# Patient Record
Sex: Male | Born: 2005
Health system: Southern US, Community
[De-identification: ages and names within clinical notes are randomized; demographics above are authoritative.]

---
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
## 2023-02-25 ENCOUNTER — Ambulatory Visit: Payer: Managed Care, Other (non HMO) | Admitting: Family Medicine

## 2023-02-25 ENCOUNTER — Encounter: Payer: Self-pay | Admitting: Family Medicine

## 2023-02-25 VITALS — BP 134/82 | HR 60 | Temp 97.9°F | Ht 66.0 in | Wt 155.2 lb

## 2023-02-25 DIAGNOSIS — Z0000 Encounter for general adult medical examination without abnormal findings: Secondary | ICD-10-CM

## 2023-02-25 DIAGNOSIS — F4320 Adjustment disorder, unspecified: Secondary | ICD-10-CM

## 2023-02-25 DIAGNOSIS — Z113 Encounter for screening for infections with a predominantly sexual mode of transmission: Secondary | ICD-10-CM

## 2023-02-25 DIAGNOSIS — Z1159 Encounter for screening for other viral diseases: Secondary | ICD-10-CM | POA: Diagnosis not present

## 2023-02-25 LAB — CBC WITH DIFFERENTIAL/PLATELET
Basophils Absolute: 0 10*3/uL (ref 0.0–0.1)
Basophils Relative: 0.2 % (ref 0.0–3.0)
Eosinophils Absolute: 0.1 10*3/uL (ref 0.0–0.7)
Eosinophils Relative: 1.1 % (ref 0.0–5.0)
HCT: 41.3 % (ref 36.0–49.0)
Hemoglobin: 14 g/dL (ref 12.0–16.0)
Lymphocytes Relative: 36.7 % (ref 24.0–48.0)
Lymphs Abs: 2 10*3/uL (ref 0.7–4.0)
MCHC: 34 g/dL (ref 31.0–37.0)
MCV: 92.7 fl (ref 78.0–98.0)
Monocytes Absolute: 0.4 10*3/uL (ref 0.1–1.0)
Monocytes Relative: 7 % (ref 3.0–12.0)
Neutro Abs: 3.1 10*3/uL (ref 1.4–7.7)
Neutrophils Relative %: 55 % (ref 43.0–71.0)
Platelets: 251 10*3/uL (ref 150.0–575.0)
RBC: 4.45 Mil/uL (ref 3.80–5.70)
RDW: 12.7 % (ref 11.4–15.5)
WBC: 5.5 10*3/uL (ref 4.5–13.5)

## 2023-02-25 LAB — COMPREHENSIVE METABOLIC PANEL
ALT: 19 U/L (ref 0–53)
AST: 28 U/L (ref 0–37)
Albumin: 4.6 g/dL (ref 3.5–5.2)
Alkaline Phosphatase: 77 U/L (ref 52–171)
BUN: 11 mg/dL (ref 6–23)
CO2: 31 mEq/L (ref 19–32)
Calcium: 9.4 mg/dL (ref 8.4–10.5)
Chloride: 101 mEq/L (ref 96–112)
Creatinine, Ser: 1.01 mg/dL (ref 0.40–1.50)
GFR: 108.43 mL/min (ref >60.00)
Glucose, Bld: 102 mg/dL — ABNORMAL HIGH (ref 70–99)
Potassium: 4.2 mEq/L (ref 3.5–5.1)
Sodium: 138 mEq/L (ref 135–145)
Total Bilirubin: 0.3 mg/dL (ref 0.3–1.2)
Total Protein: 7.3 g/dL (ref 6.0–8.3)

## 2023-02-25 LAB — LIPID PANEL
Cholesterol: 184 mg/dL (ref 0–200)
HDL: 78.6 mg/dL (ref >39.00)
LDL Cholesterol: 93 mg/dL (ref 0–99)
NonHDL: 105.81
Total CHOL/HDL Ratio: 2
Triglycerides: 63 mg/dL (ref 0.0–149.0)
VLDL: 12.6 mg/dL (ref 0.0–40.0)

## 2023-02-25 LAB — HEMOGLOBIN A1C: Hgb A1c MFr Bld: 5.7 % (ref 4.6–6.5)

## 2023-02-25 LAB — T4, FREE: Free T4: 0.8 ng/dL (ref 0.60–1.60)

## 2023-02-25 LAB — TSH: TSH: 1.83 u[IU]/mL (ref 0.40–5.00)

## 2023-02-25 LAB — VITAMIN B12: Vitamin B-12: 427 pg/mL (ref 211–911)

## 2023-02-25 NOTE — Progress Notes (Signed)
Established Patient Office Visit   Subjective  Patient ID: Luis Ramos, male    DOB: [birth year]  Age: 17 y.o. MRN: [id number]  Chief Complaint  Patient presents with   Establish Care    Nothing specific to discuss    Patient is an 17 year old male with no significant pmh who presents for establish care and CPE.  Patient states he is well overall.  Not currently taking any medications.  Endorses feeling off at times.  Has noticed increased worrying about things but is not that serious.  Patient is a Printmaker at [employer]. Also notes not as confident in himself when playing football.  Patient notes sleep, mood, energy, appetite are good.  Patient thinks symptoms may be related to starting college.  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  Allergies: NKDA  Surgeries: tonsil and adenoidectomy 2-3 years ago.       ROS Negative unless stated above    Objective:     BP 134/82 (BP Location: Right Arm, Patient Position: Sitting, Cuff Size: Normal)   Pulse 60   Temp 97.9 F (36.6 C) (Oral)   Ht  (1.676 m)   Wt 155 lb 3.2 oz (70.4 kg)   SpO2 99%   BMI 25.05 kg/m     Physical Exam Constitutional:      Appearance: Normal appearance.  HENT:     Head: Normocephalic and atraumatic.     Right Ear: Tympanic membrane, ear canal and external ear normal.     Left Ear: Tympanic membrane, ear canal and external ear normal.     Nose: Nose normal.     Mouth/Throat:     Mouth: Mucous membranes are moist.     Pharynx: No oropharyngeal exudate or posterior oropharyngeal erythema.  Eyes:     General: No scleral icterus.    Extraocular Movements: Extraocular movements intact.     Conjunctiva/sclera: Conjunctivae normal.     Pupils: Pupils are equal, round, and reactive to light.  Neck:     Thyroid: No thyromegaly.  Cardiovascular:     Rate and Rhythm: Normal rate and regular rhythm.     Pulses: Normal pulses.     Heart sounds: Normal heart sounds. No murmur heard.     No friction rub.  Pulmonary:     Effort: Pulmonary effort is normal.     Breath sounds: Normal breath sounds. No wheezing, rhonchi or rales.  Abdominal:     General: Bowel sounds are normal.     Palpations: Abdomen is soft.     Tenderness: There is no abdominal tenderness.  Musculoskeletal:        General: No deformity. Normal range of motion.  Lymphadenopathy:     Cervical: No cervical adenopathy.  Skin:    General: Skin is warm and dry.     Findings: No lesion.  Neurological:     General: No focal deficit present.     Mental Status: He is alert and oriented to person, place, and time.  Psychiatric:        Mood and Affect: Mood normal.        Thought Content: Thought content normal.        02/25/2023    8:15 AM  Depression screen PHQ 2/9  Decreased Interest 0  Down, Depressed, Hopeless 0  PHQ - 2 Score 0      02/25/2023    8:15 AM  GAD 7 : Generalized Anxiety Score  Nervous, Anxious, on Edge  0  Control/stop worrying 0  Worry too much - different things 1  Trouble relaxing 0  Restless 0  Easily annoyed or irritable 0  Afraid - awful might happen 0  Total GAD 7 Score 1     No results found for any visits on 02/25/23.    Assessment & Plan:  Well adult exam -Anticipatory guidance given including wearing seatbelts, smoke detectors in the home, increasing physical activity, increasing p.o. intake of water and vegetables. -labs -Age-appropriate health screenings -Immunizations reviewed -Next CPE in 1 year -     CBC with Differential/Platelet -     TSH -     T4, free -     Hemoglobin A1c -     Lipid panel -     Comprehensive metabolic panel  Routine screening for STI (sexually transmitted infection) -     RPR -     HIV Antibody (routine testing w rflx) -     C. trachomatis/N. gonorrhoeae RNA  Encounter for hepatitis C screening test for low risk patient -     Hepatitis C antibody  Adjustment disorder, unspecified type -PHQ-9 score 0 -GAD-7 score  1 -Discussed importance of self-care -Given information on counseling.  Consider medication options for continued or worsening symptoms. -Follow-up with 6 to 8 weeks if needed for continued symptoms. -     TSH -     T4, free -     Vitamin B12   Return in about 6 weeks (around 04/08/2023), or if symptoms worsen or fail to improve.   Deeann Saint, MD

## 2023-02-25 NOTE — Patient Instructions (Signed)
Behavioral Health Services: -to make an appointment contact the office/provider you are interested in seeing.  No referral is needed.  The below is not an all inclusive list, but will help you get started.  [email or web address] -counseling located off of [address]  [email or web address] -website helps you find providers in your area  [facility] -Located off of [address] across from [employer]  Dr. Akintayo is a Psychiatrist with [place]. [phone]  [facility]  [facility]  -[address]  [phone] -a place in town that has counseling and Psychiatry services.    

## 2023-02-26 LAB — HIV ANTIBODY (ROUTINE TESTING W REFLEX): HIV 1&2 Ab, 4th Generation: NONREACTIVE

## 2023-02-26 LAB — C. TRACHOMATIS/N. GONORRHOEAE RNA
C. trachomatis RNA, TMA: DETECTED — AB
N. gonorrhoeae RNA, TMA: NOT DETECTED

## 2023-02-26 LAB — RPR: RPR Ser Ql: NONREACTIVE

## 2023-02-26 LAB — HEPATITIS C ANTIBODY: Hepatitis C Ab: NONREACTIVE

## 2023-02-26 NOTE — Telephone Encounter (Signed)
Forms placed in scan bin

## 2023-02-27 ENCOUNTER — Other Ambulatory Visit: Payer: Self-pay | Admitting: Family Medicine

## 2023-02-27 DIAGNOSIS — A749 Chlamydial infection, unspecified: Secondary | ICD-10-CM

## 2023-02-27 MED ORDER — DOXYCYCLINE HYCLATE 100 MG PO TABS
100.0000 mg | ORAL_TABLET | Freq: Two times a day (BID) | ORAL | 0 refills | Status: AC
Start: 2023-02-27 — End: 2023-03-06

## 2023-09-21 ENCOUNTER — Other Ambulatory Visit: Payer: Self-pay | Admitting: Family Medicine

## 2023-09-21 DIAGNOSIS — A749 Chlamydial infection, unspecified: Secondary | ICD-10-CM

## 2023-09-23 ENCOUNTER — Encounter: Payer: Self-pay | Admitting: Family Medicine

## 2023-09-23 ENCOUNTER — Ambulatory Visit: Payer: Managed Care, Other (non HMO) | Admitting: Family Medicine

## 2023-09-23 VITALS — BP 98/68 | HR 82 | Temp 98.4°F | Ht 66.0 in | Wt 151.0 lb

## 2023-09-23 DIAGNOSIS — Z202 Contact with and (suspected) exposure to infections with a predominantly sexual mode of transmission: Secondary | ICD-10-CM | POA: Diagnosis not present

## 2023-09-23 MED ORDER — CEFTRIAXONE SODIUM 500 MG IJ SOLR
500.0000 mg | Freq: Once | INTRAMUSCULAR | Status: AC
  Administered 2023-09-23: 500 mg via INTRAMUSCULAR

## 2023-09-23 MED ORDER — DOXYCYCLINE HYCLATE 100 MG PO TABS: 100.0000 mg | ORAL_TABLET | Freq: Two times a day (BID) | ORAL | 0 refills | Status: AC

## 2023-09-23 NOTE — Addendum Note (Signed)
Addended by: Abbe Amsterdam R on: 09/23/2023 02:35 PM   Modules accepted: Level of Service

## 2023-09-23 NOTE — Progress Notes (Signed)
Established Patient Office Visit   Subjective  Patient ID: Luis Ramos, male    DOB: 2006-09-11  Age: 17 y.o. MRN: [id number]  Chief Complaint  Patient presents with   Exposure to STD    Started a week ago     Patient is a 17 year old male seen for acute concern.  Patient endorses being advised that he had chlamydia by one of his partners.  Endorses unprotected intercourse last Thursday with partner.  Pt states he feels like he did once before when he tested positive.  States at times has a bump in genital area that is non pruritic, does not burn, and is not painful.  Pt denies penile d/c, dysuria, frequency.  Patient mentions mood has improved since last visit.  Football is going well.  Exposure to STD    There are no problems to display for this patient.  History reviewed. No pertinent past medical history. Past Surgical History:  Procedure Laterality Date   TONSILLECTOMY AND ADENOIDECTOMY  2022   Social History   Tobacco Use   Smoking status: Never   Smokeless tobacco: Never  Substance Use Topics   Alcohol use: Never   Drug use: Never   History reviewed. No pertinent family history. No Known Allergies    ROS Negative unless stated above    Objective:     BP 98/68 (BP Location: Right Arm, Patient Position: Sitting, Cuff Size: Normal)   Pulse 82   Temp 98.4 F (36.9 C) (Oral)   Ht 5\' 6"  (1.676 m)   Wt 151 lb (68.5 kg)   SpO2 97%   BMI 24.37 kg/m  BP Readings from Last 3 Encounters:  09/23/23 98/68  02/25/23 134/82  07/01/22 125/76   Wt Readings from Last 3 Encounters:  09/23/23 151 lb (68.5 kg) (46%, Z= -0.10)*  02/25/23 155 lb 3.2 oz (70.4 kg) (56%, Z= 0.16)*  07/01/22 155 lb (70.3 kg) (60%, Z= 0.26)*   * Growth percentiles are based on CDC (Boys, 2-20 Years) data.      Physical Exam Constitutional:      General: He is not in acute distress.    Appearance: Normal appearance.  HENT:     Head: Normocephalic and atraumatic.     Nose:  Nose normal.     Mouth/Throat:     Mouth: Mucous membranes are moist.  Eyes:     Extraocular Movements: Extraocular movements intact.     Conjunctiva/sclera: Conjunctivae normal.  Cardiovascular:     Rate and Rhythm: Normal rate and regular rhythm.     Heart sounds: Normal heart sounds. No murmur heard.    No gallop.  Pulmonary:     Effort: Pulmonary effort is normal. No respiratory distress.     Breath sounds: Normal breath sounds. No wheezing, rhonchi or rales.  Skin:    General: Skin is warm and dry.  Neurological:     Mental Status: He is alert and oriented to person, place, and time.     No results found for any visits on 09/23/23.    Assessment & Plan:  Possible exposure to STI -     C. trachomatis/N. gonorrhoeae RNA -     RPR -     HIV Antibody (routine testing w rflx) -     cefTRIAXone Sodium -     Doxycycline Hyclate; Take 1 tablet (100 mg total) by mouth 2 (two) times daily for 7 days.  Dispense: 14 tablet; Refill: 0  Exposure to chlamydia.  Obtain  STI testing.  Will treat for chlamydia and gonorrhea while waiting on results.  Given dose of ceftriaxone 500 mg IM in clinic.  Rx for doxycycline sent to pharmacy for chlamydia.  Patient advised to complete antibiotic course.  Counseling and condoms provided.  Return if symptoms worsen or fail to improve.   Deeann Saint, MD

## 2023-09-23 NOTE — Patient Instructions (Signed)
Complete full course of doxycycline.

## 2023-09-24 LAB — C. TRACHOMATIS/N. GONORRHOEAE RNA
C. trachomatis RNA, TMA: DETECTED — AB
N. gonorrhoeae RNA, TMA: NOT DETECTED

## 2023-09-24 LAB — HIV ANTIBODY (ROUTINE TESTING W REFLEX): HIV 1&2 Ab, 4th Generation: NONREACTIVE

## 2023-09-24 LAB — RPR: RPR Ser Ql: NONREACTIVE

## 2024-01-06 ENCOUNTER — Ambulatory Visit (INDEPENDENT_AMBULATORY_CARE_PROVIDER_SITE_OTHER): Payer: Managed Care, Other (non HMO) | Admitting: Family Medicine

## 2024-01-06 DIAGNOSIS — Z91199 Patient's noncompliance with other medical treatment and regimen due to unspecified reason: Secondary | ICD-10-CM

## 2024-01-06 NOTE — Progress Notes (Signed)
    Canceled ~15 minutes prior to appt

## 2024-03-25 ENCOUNTER — Encounter: Payer: Self-pay | Admitting: Family Medicine

## 2024-03-25 ENCOUNTER — Ambulatory Visit (INDEPENDENT_AMBULATORY_CARE_PROVIDER_SITE_OTHER): Admitting: Family Medicine

## 2024-03-25 VITALS — BP 120/82 | HR 88 | Temp 98.0°F | Ht 66.14 in | Wt 159.0 lb

## 2024-03-25 DIAGNOSIS — Z0000 Encounter for general adult medical examination without abnormal findings: Secondary | ICD-10-CM | POA: Diagnosis not present

## 2024-03-25 DIAGNOSIS — Z113 Encounter for screening for infections with a predominantly sexual mode of transmission: Secondary | ICD-10-CM

## 2024-03-25 DIAGNOSIS — Z889 Allergy status to unspecified drugs, medicaments and biological substances status: Secondary | ICD-10-CM

## 2024-03-25 LAB — COMPREHENSIVE METABOLIC PANEL WITH GFR
ALT: 17 U/L (ref 0–53)
AST: 25 U/L (ref 0–37)
Albumin: 4.5 g/dL (ref 3.5–5.2)
Alkaline Phosphatase: 71 U/L (ref 52–171)
BUN: 14 mg/dL (ref 6–23)
CO2: 32 meq/L (ref 19–32)
Calcium: 9.5 mg/dL (ref 8.4–10.5)
Chloride: 102 meq/L (ref 96–112)
Creatinine, Ser: 1.04 mg/dL (ref 0.40–1.50)
GFR: 103.89 mL/min (ref >60.00)
Glucose, Bld: 101 mg/dL — ABNORMAL HIGH (ref 70–99)
Potassium: 4.2 meq/L (ref 3.5–5.1)
Sodium: 139 meq/L (ref 135–145)
Total Bilirubin: 0.3 mg/dL (ref 0.2–1.2)
Total Protein: 7 g/dL (ref 6.0–8.3)

## 2024-03-25 LAB — CBC WITH DIFFERENTIAL/PLATELET
Basophils Absolute: 0 10*3/uL (ref 0.0–0.1)
Basophils Relative: 0.4 % (ref 0.0–3.0)
Eosinophils Absolute: 0 10*3/uL (ref 0.0–0.7)
Eosinophils Relative: 0.6 % (ref 0.0–5.0)
HCT: 41.5 % (ref 36.0–49.0)
Hemoglobin: 14.1 g/dL (ref 12.0–16.0)
Lymphocytes Relative: 44.1 % (ref 24.0–48.0)
Lymphs Abs: 1.6 10*3/uL (ref 0.7–4.0)
MCHC: 33.9 g/dL (ref 31.0–37.0)
MCV: 93.6 fl (ref 78.0–98.0)
Monocytes Absolute: 0.4 10*3/uL (ref 0.1–1.0)
Monocytes Relative: 11.3 % (ref 3.0–12.0)
Neutro Abs: 1.6 10*3/uL (ref 1.4–7.7)
Neutrophils Relative %: 43.6 % (ref 43.0–71.0)
Platelets: 243 10*3/uL (ref 150.0–575.0)
RBC: 4.43 Mil/uL (ref 3.80–5.70)
RDW: 12.8 % (ref 11.4–15.5)
WBC: 3.7 10*3/uL — ABNORMAL LOW (ref 4.5–13.5)

## 2024-03-25 LAB — HEMOGLOBIN A1C: Hgb A1c MFr Bld: 5.9 % (ref 4.6–6.5)

## 2024-03-25 MED ORDER — FLUTICASONE PROPIONATE 50 MCG/ACT NA SUSP: 1.0000 | Freq: Every day | NASAL | 5 refills | Status: AC

## 2024-03-25 NOTE — Progress Notes (Signed)
 Established Patient Office Visit   Subjective  Patient ID: Luis Ramos, male    DOB: 27-Jan-2006  Age: 18 y.o. MRN: [id number]  Chief Complaint  Patient presents with   Annual Exam    Patient is a 18 year old male seen for CPE.  Patient states he is doing well overall.  School is going well.  Patient still playing football and [employer].  Has a few weeks off for the summer before football starts.  States mood and energy has been good.  Requesting refill on Flonase.  Patient requesting documentation of visit as physical needed to continue playing sports.    There are no active problems to display for this patient.  History reviewed. No pertinent past medical history. Past Surgical History:  Procedure Laterality Date   TONSILLECTOMY AND ADENOIDECTOMY  2022   Social History   Tobacco Use   Smoking status: Never   Smokeless tobacco: Never  Substance Use Topics   Alcohol use: Never   Drug use: Never   History reviewed. No pertinent family history. No Known Allergies  ROS Negative unless stated above    Objective:      BP 120/82 (BP Location: Left Arm, Patient Position: Sitting, Cuff Size: Normal)   Pulse 88   Temp 98 F (36.7 C) (Oral)   Ht 5' 6.14" (1.68 m)   Wt 159 lb (72.1 kg)   SpO2 98%   BMI 25.55 kg/m  BP Readings from Last 3 Encounters:  03/25/24 120/82  09/23/23 98/68  02/25/23 134/82   Wt Readings from Last 3 Encounters:  03/25/24 159 lb (72.1 kg) (56%, Z= 0.15)*  09/23/23 151 lb (68.5 kg) (46%, Z= -0.10)*  02/25/23 155 lb 3.2 oz (70.4 kg) (56%, Z= 0.16)*   * Growth percentiles are based on CDC (Boys, 2-20 Years) data.      Physical Exam Constitutional:      Appearance: Normal appearance.  HENT:     Head: Normocephalic and atraumatic.     Right Ear: Tympanic membrane, ear canal and external ear normal.     Left Ear: Tympanic membrane, ear canal and external ear normal.     Nose: Nose normal.     Mouth/Throat:     Mouth: Mucous membranes  are moist.     Pharynx: No oropharyngeal exudate or posterior oropharyngeal erythema.  Eyes:     General: No scleral icterus.    Extraocular Movements: Extraocular movements intact.     Conjunctiva/sclera: Conjunctivae normal.     Pupils: Pupils are equal, round, and reactive to light.  Neck:     Thyroid : No thyromegaly.  Cardiovascular:     Rate and Rhythm: Normal rate and regular rhythm.     Pulses: Normal pulses.     Heart sounds: Normal heart sounds. No murmur heard.    No friction rub.  Pulmonary:     Effort: Pulmonary effort is normal.     Breath sounds: Normal breath sounds. No wheezing, rhonchi or rales.  Abdominal:     General: Bowel sounds are normal.     Palpations: Abdomen is soft.     Tenderness: There is no abdominal tenderness.  Musculoskeletal:        General: No deformity. Normal range of motion.  Lymphadenopathy:     Cervical: No cervical adenopathy.  Skin:    General: Skin is warm and dry.     Findings: No lesion.  Neurological:     General: No focal deficit present.  Mental Status: He is alert and oriented to person, place, and time.  Psychiatric:        Mood and Affect: Mood normal.        Thought Content: Thought content normal.        09/23/2023    2:08 PM 02/25/2023    8:15 AM  Depression screen PHQ 2/9  Decreased Interest 0 0  Down, Depressed, Hopeless 0 0  PHQ - 2 Score 0 0  Altered sleeping 0   Tired, decreased energy 0   Change in appetite 0   Feeling bad or failure about yourself  0   Trouble concentrating 0   Moving slowly or fidgety/restless 0   Suicidal thoughts 0   PHQ-9 Score 0   Difficult doing work/chores Not difficult at all       03/25/2024    9:58 AM 09/23/2023    2:15 PM 02/25/2023    8:15 AM  GAD 7 : Generalized Anxiety Score  Nervous, Anxious, on Edge 0 0 0  Control/stop worrying 2 2 0  Worry too much - different things 0 1 1  Trouble relaxing 0 0 0  Restless 1 0 0  Easily annoyed or irritable 1 0 0  Afraid -  awful might happen 0 0 0  Total GAD 7 Score 4 3 1   Anxiety Difficulty Not difficult at all Somewhat difficult      No results found for any visits on 03/25/24.    Assessment & Plan:   Well adult exam -     CBC with Differential/Platelet; Future -     Comprehensive metabolic panel with GFR; Future -     Hemoglobin A1c; Future -     TSH; Future -     T4, free; Future  Routine screening for STI (sexually transmitted infection) -     RPR -     HIV Antibody (routine testing w rflx) -     C. trachomatis/N. gonorrhoeae RNA  Age-appropriate health screenings discussed.  Will obtain labs including STI testing.  Flonase refilled.  Letter provided stating patient had CPE for this year and is able to get sports.  If needed patient will send physical form for completion.  Return in about 1 year (around 03/25/2025) for physical.  Sooner if needed for other concerns.   Viola Greulich, MD

## 2024-03-26 LAB — C. TRACHOMATIS/N. GONORRHOEAE RNA
C. trachomatis RNA, TMA: NOT DETECTED
N. gonorrhoeae RNA, TMA: NOT DETECTED

## 2024-03-26 LAB — RPR: RPR Ser Ql: NONREACTIVE

## 2024-03-26 LAB — HIV ANTIBODY (ROUTINE TESTING W REFLEX): HIV 1&2 Ab, 4th Generation: NONREACTIVE

## 2024-03-28 ENCOUNTER — Ambulatory Visit: Payer: Self-pay | Admitting: Family Medicine

## 2024-03-29 LAB — T4, FREE: Free T4: 0.81 ng/dL (ref 0.60–1.60)

## 2024-03-29 LAB — TSH: TSH: 1.01 u[IU]/mL (ref 0.40–5.00)

## 2024-11-02 ENCOUNTER — Emergency Department (HOSPITAL_BASED_OUTPATIENT_CLINIC_OR_DEPARTMENT_OTHER)
Admission: EM | Admit: 2024-11-02 | Discharge: 2024-11-02 | Disposition: A | Discharge status: Home / Self Care | Attending: Emergency Medicine | Admitting: Emergency Medicine

## 2024-11-02 ENCOUNTER — Encounter (HOSPITAL_BASED_OUTPATIENT_CLINIC_OR_DEPARTMENT_OTHER): Payer: Self-pay | Admitting: Emergency Medicine

## 2024-11-02 DIAGNOSIS — M542 Cervicalgia: Secondary | ICD-10-CM | POA: Diagnosis not present

## 2024-11-02 DIAGNOSIS — Y92410 Unspecified street and highway as the place of occurrence of the external cause: Secondary | ICD-10-CM | POA: Insufficient documentation

## 2024-11-02 DIAGNOSIS — R519 Headache, unspecified: Secondary | ICD-10-CM | POA: Diagnosis present

## 2024-11-02 MED ORDER — IBUPROFEN 800 MG PO TABS
800.0000 mg | ORAL_TABLET | Freq: Once | ORAL | Status: AC
  Administered 2024-11-02: 800 mg via ORAL
  Filled 2024-11-02: qty 1

## 2024-11-02 MED ORDER — IBUPROFEN 600 MG PO TABS: 600.0000 mg | ORAL_TABLET | Freq: Four times a day (QID) | ORAL | 0 refills | Status: AC | PRN

## 2024-11-02 MED ORDER — CYCLOBENZAPRINE HCL 10 MG PO TABS: 10.0000 mg | ORAL_TABLET | Freq: Two times a day (BID) | ORAL | 0 refills | Status: AC | PRN
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
## 2024-11-02 NOTE — ED Notes (Signed)
 DC paperwork given and verbally understood.
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
## 2024-12-01 ENCOUNTER — Telehealth: Payer: Self-pay | Admitting: Family Medicine

## 2024-12-01 NOTE — Telephone Encounter (Signed)
 Copied from CRM #[id number]. Topic: General - Other >> Dec 01, 2024  3:47 PM Larissa RAMAN wrote: Reason for CRM: Patient's mother requesting a callback from PCP/nurse. She states patient has a form that needs to be completed and returned by 12/02/24 at noon. She states this is an emergency and needs to be completed as soon as possible. Requesting a callback at [phone] or [phone].  Please advise

## 2024-12-02 DIAGNOSIS — Z0279 Encounter for issue of other medical certificate: Secondary | ICD-10-CM

## 2024-12-02 NOTE — Telephone Encounter (Signed)
 Forms have been filled out and are at the front desk ready for pick-up

## 2024-12-02 NOTE — Telephone Encounter (Signed)
 Pt mom dropped off the form and she is aware md not in the office right now and mom does have an appt at 1130 am to see Dr Mercer. The folder with charge sheet is with Dr Mercer CMA

## 2025-03-27 ENCOUNTER — Encounter: Admitting: Family Medicine
